# Patient Record
Sex: Female | Born: 1985 | Race: White | Hispanic: No | Marital: Single | Smoking: Former smoker
Health system: Southern US, Community
[De-identification: ages and names within clinical notes are randomized; demographics above are authoritative.]

## PROBLEM LIST (undated history)

## (undated) ENCOUNTER — Inpatient Hospital Stay (HOSPITAL_COMMUNITY): Payer: Self-pay

## (undated) HISTORY — PX: UMBILICAL HERNIA REPAIR: SHX196

---

## 2006-06-10 ENCOUNTER — Other Ambulatory Visit: Payer: Self-pay

## 2006-06-10 ENCOUNTER — Emergency Department: Payer: Self-pay | Admitting: Emergency Medicine

## 2007-09-26 ENCOUNTER — Emergency Department: Payer: Self-pay | Admitting: Emergency Medicine

## 2008-06-03 ENCOUNTER — Inpatient Hospital Stay: Payer: Self-pay | Admitting: Unknown Physician Specialty

## 2009-01-17 ENCOUNTER — Ambulatory Visit: Payer: Self-pay

## 2009-11-24 ENCOUNTER — Ambulatory Visit: Payer: Self-pay | Admitting: Surgery

## 2010-11-17 ENCOUNTER — Other Ambulatory Visit: Payer: Self-pay | Admitting: Psychiatry

## 2011-05-06 ENCOUNTER — Emergency Department: Payer: Self-pay | Admitting: Emergency Medicine

## 2015-10-31 NOTE — Progress Notes (Signed)
 Elizabeth Kane is seen for depression/anxiety, hyperlipidemia.  Contacted us  earlier this month with worsening depression symptoms.  Sertraline was restarted at 50 mg dose, and she really has done well with this.  She previously had been on 100 mg, but she feels like this dose has been effective.  No thoughts of harming herself or others.  Nothing that really set this off, just felt like she was a little bit more irritable, and states that some other people had noticed this.  She remains active, is getting adequate sleep.  She had labs done through work and her cholesterol remains slightly elevated; she is not sure if her thyroid was checked, and she will get us  a copy of those labs.  Current Outpatient Prescriptions  Medication Sig Dispense Refill  . norethindrone-ethinyl estradiol-iron (JUNEL FE 1.5/30, 28,) 1.5 mg-30 mcg (21)/75 mg (7) tablet Take 1 tablet by mouth once daily. 112 tablet 3  . sertraline (ZOLOFT) 50 MG tablet Take 1 tablet (50 mg total) by mouth once daily. 30 tablet 5   No current facility-administered medications for this visit.     Allergies as of 10/31/2015  . (No Known Allergies)    Past Medical History:  Diagnosis Date  . Anxiety and depression    with suicide gestures in the past, evaluated by Psychiatry  . Pure hypercholesterolemia 12/09/2014  . Severe menstrual cramps    followed by Gynecology    History reviewed. No pertinent surgical history.  Health Maintenance Topics with due status: Overdue     Topic Date Due   Influenza Vaccine 09/30/2015    History reviewed. No pertinent family history.  Social History   Social History  . Marital status: Single    Spouse name: N/A  . Number of children: N/A  . Years of education: N/A   Social History Main Topics  . Smoking status: Never Smoker  . Smokeless tobacco: Never Used  . Alcohol use Yes     Comment: Very rare alcohol.  . Drug use: None  . Sexual activity: Not Asked   Other Topics Concern  .  None   Social History Narrative    Results for orders placed or performed in visit on 12/02/14  CBC w/auto Differential (5 Part)  Result Value Ref Range   WBC (White Blood Cell Count) 7.0 4.1 - 10.2 10^3/uL   RBC (Red Blood Cell Count) 4.62 4.04 - 5.48 10^6/uL   Hemoglobin 14.2 12.0 - 15.0 gm/dL   Hematocrit 58.2 64.9 - 47.0 %   MCV (Mean Corpuscular Volume) 90.3 80.0 - 100.0 fl   MCH (Mean Corpuscular Hemoglobin) 30.7 27.0 - 31.2 pg   MCHC (Mean Corpuscular Hemoglobin Concentration) 34.1 32.0 - 36.0 gm/dL   Platelet Count 706 849 - 450 10^3/uL   RDW-CV (Red Cell Distribution Width) 13.0 11.6 - 14.8 %   MPV (Mean Platelet Volume) 11.1 (H) 8.0 - 10.0 fl   Neutrophils 2.89 1.50 - 7.80 10^3/uL   Lymphocytes 3.20 1.00 - 3.60 10^3/uL   Monocytes 0.55 0.00 - 1.50 10^3/uL   Eosinophils 0.21 0.00 - 0.55 10^3/uL   Basophils 0.14 (H) 0.00 - 0.09 10^3/uL   Neutrophil % 41.3 32.0 - 70.0 %   Lymphocyte % 45.7 10.0 - 50.0 %   Monocyte % 7.9 4.0 - 13.0 %   Eosinophil % 3.0 1.0 - 5.0 %   Basophil% 2.0 0.0 - 2.0 %   Immature Granulocyte % 0.1 <=0.7 %   Immature Granulocyte Count 0.01 <=0.06 10^3/L  Comprehensive  Metabolic Panel (CMP)  Result Value Ref Range   Glucose 85 70 - 110 mg/dL   Sodium 860 863 - 854 mmol/L   Potassium 4.2 3.6 - 5.1 mmol/L   Chloride 105 97 - 109 mmol/L   Carbon Dioxide (CO2) 26.5 22.0 - 32.0 mmol/L   Urea Nitrogen (BUN) 10 7 - 25 mg/dL   Creatinine 0.8 0.6 - 1.1 mg/dL   Glomerular Filtration Rate (eGFR), MDRD Estimate 85 >60 mL/min/1.73sq m   Calcium 9.2 8.7 - 10.3 mg/dL   AST  17 8 - 39 U/L   ALT  13 5 - 38 U/L   Alk Phos (alkaline Phosphatase) 43 34 - 104 U/L   Albumin 4.2 3.5 - 4.8 g/dL   Bilirubin, Total 0.6 0.3 - 1.2 mg/dL   Protein, Total 7.0 6.1 - 7.9 g/dL   A/G Ratio 1.5 1.0 - 5.0 gm/dL  Lipid Panel w/calc LDL  Result Value Ref Range   Cholesterol, Total 211 (H) 100 - 200 mg/dL   Triglyceride 99 35 - 199 mg/dL   HDL (High Density Lipoprotein)  Cholesterol 59.8 35.0 - 85.0 mg/dL   LDL (Low Density Lipoprotien), Calculated 131 (H) 0 - 130 mg/dL   VLDL Cholesterol 20 mg/dL   Cholesterol/HDL Ratio 3.5   Thyroid Stimulating-Hormone (TSH)  Result Value Ref Range   Thyroid Stimulating Hormone (TSH) 4.649 0.340 - 5.600 uIU/mL  Urinalysis w/Microscopic  Result Value Ref Range   Color Yellow Yellow, Straw   Clarity Clear Clear   Specific Gravity 1.020 1.000 - 1.030   pH, Urine 6.0 5.0 - 8.0   Protein, Urinalysis Negative Negative, Trace mg/dL   Glucose, Urinalysis Negative Negative mg/dL   Ketones, Urinalysis Negative Negative mg/dL   Blood, Urinalysis Large (!) Negative   Nitrite, Urinalysis Negative Negative   Leukocyte Esterase, Urinalysis Negative Negative   White Blood Cells, Urinalysis 0-3 None Seen, 0-3 /hpf   Red Blood Cells, Urinalysis 4-10 (!) None Seen, 0-3 /hpf   Bacteria, Urinalysis Rare (!) None Seen /hpf   Squamous Epithelial Cells, Urinalysis Moderate (!) Rare, Few, None Seen /hpf    ROS: Please see HPI; recently saw gynecology remainder of complete 10 point ROS is negative  BP (P) 110/80  Pulse (P) 77  Ht (P) 157.5 cm (5' 2)  Wt (P) 63 kg (139 lb)  LMP 10/30/2015 (Approximate)  SpO2 (P) 97%  BMI (P) 25.42 kg/m2  GENERAL:  Well developed, well nourished female, in no distress. NECK:  Supple, trachea midline.  No thyromegaly.   CHEST:  Normal to palpation. LUNGS:  Clear to auscultation and percussion.  No retractions or wheezing.  CARDIAC:  Regular rate and rhythm, normal S1 and S2 without murmurs, rubs or gallops.   EXTREMITIES:  No clubbing, cyanosis, edema.   NEUROLOGIC:  Cranial nerves II-XII grossly intact.   Gait normal DERMATOLOGIC:   No significant rashes or nodules. LYMPH:  No cervical or supraclavicular lymphadenopathy    Pure hypercholesterolemia  (primary encounter diagnosis) Recurrent major depressive disorder, in full remission (CMS-HCC)  Assessment and Plan  1.   Depression/anxiety-stable with current regimen.  She knows to contact us  if symptoms are worsening; she may see some further improvement as she is on this a little bit longer.  She is aware that she should not stop the medication abruptly; she has used in the past 2.  Hyperlipidemia.  Has been mildly elevated in the past; urged efforts with diet, exercise as tolerated.  Follow liver, lipids, metabolic panel, TSH  3.  Health maintenance.  She will return for annual exam in 2 months, returning sooner if needed.  She will bring her labs in from [employer] [employer].  BERT JACK KLEIN III, MD  Portions of this note were created using dictation software and may contain typographical errors.

## 2016-06-23 ENCOUNTER — Encounter: Payer: Self-pay | Admitting: *Deleted

## 2016-06-23 ENCOUNTER — Emergency Department: Payer: Managed Care, Other (non HMO)

## 2016-06-23 ENCOUNTER — Emergency Department
Admission: EM | Admit: 2016-06-23 | Discharge: 2016-06-23 | Disposition: A | Discharge status: Home / Self Care | Payer: Managed Care, Other (non HMO) | Attending: Emergency Medicine | Admitting: Emergency Medicine

## 2016-06-23 DIAGNOSIS — R110 Nausea: Secondary | ICD-10-CM | POA: Diagnosis not present

## 2016-06-23 DIAGNOSIS — R1030 Lower abdominal pain, unspecified: Secondary | ICD-10-CM | POA: Insufficient documentation

## 2016-06-23 DIAGNOSIS — R109 Unspecified abdominal pain: Secondary | ICD-10-CM

## 2016-06-23 LAB — CBC
HEMATOCRIT: 43 % (ref 35.0–47.0)
HEMOGLOBIN: 14.9 g/dL (ref 12.0–16.0)
MCH: 29.9 pg (ref 26.0–34.0)
MCHC: 34.7 g/dL (ref 32.0–36.0)
MCV: 86.2 fL (ref 80.0–100.0)
Platelets: 295 10*3/uL (ref 150–440)
RBC: 4.99 MIL/uL (ref 3.80–5.20)
RDW: 14.2 % (ref 11.5–14.5)
WBC: 9.5 10*3/uL (ref 3.6–11.0)

## 2016-06-23 LAB — COMPREHENSIVE METABOLIC PANEL
ALBUMIN: 4.1 g/dL (ref 3.5–5.0)
ALK PHOS: 53 U/L (ref 38–126)
ALT: 18 U/L (ref 14–54)
ANION GAP: 9 (ref 5–15)
AST: 23 U/L (ref 15–41)
BUN: 9 mg/dL (ref 6–20)
CALCIUM: 9.2 mg/dL (ref 8.9–10.3)
CHLORIDE: 101 mmol/L (ref 101–111)
CO2: 26 mmol/L (ref 22–32)
Creatinine, Ser: 0.88 mg/dL (ref 0.44–1.00)
GFR calc non Af Amer: >60 mL/min (ref >60)
Glucose, Bld: 95 mg/dL (ref 65–99)
POTASSIUM: 3.8 mmol/L (ref 3.5–5.1)
SODIUM: 136 mmol/L (ref 135–145)
Total Bilirubin: 0.7 mg/dL (ref 0.3–1.2)
Total Protein: 7.6 g/dL (ref 6.5–8.1)

## 2016-06-23 LAB — URINALYSIS, COMPLETE (UACMP) WITH MICROSCOPIC
BACTERIA UA: NONE SEEN
Bilirubin Urine: NEGATIVE
Glucose, UA: NEGATIVE mg/dL
HGB URINE DIPSTICK: NEGATIVE
KETONES UR: NEGATIVE mg/dL
NITRITE: NEGATIVE
PROTEIN: 30 mg/dL — AB
Specific Gravity, Urine: 1.026 (ref 1.005–1.030)
pH: 6 (ref 5.0–8.0)

## 2016-06-23 LAB — POCT PREGNANCY, URINE: PREG TEST UR: NEGATIVE

## 2016-06-23 LAB — LIPASE, BLOOD: LIPASE: 22 U/L (ref 11–51)

## 2016-06-23 MED ORDER — IOPAMIDOL (ISOVUE-300) INJECTION 61%
100.0000 mL | Freq: Once | INTRAVENOUS | Status: AC | PRN
  Administered 2016-06-23: 100 mL via INTRAVENOUS

## 2016-06-23 MED ORDER — ONDANSETRON HCL 4 MG/2ML IJ SOLN
4.0000 mg | Freq: Once | INTRAMUSCULAR | Status: AC
  Administered 2016-06-23: 4 mg via INTRAVENOUS
  Filled 2016-06-23: qty 2

## 2016-06-23 MED ORDER — IOPAMIDOL (ISOVUE-300) INJECTION 61%
30.0000 mL | Freq: Once | INTRAVENOUS | Status: AC | PRN
  Administered 2016-06-23: 30 mL via ORAL

## 2016-06-23 MED ORDER — HYDROCODONE-ACETAMINOPHEN 5-325 MG PO TABS: 2.0000 | ORAL_TABLET | Freq: Four times a day (QID) | ORAL | 0 refills | Status: AC | PRN

## 2016-06-23 MED ORDER — MORPHINE SULFATE (PF) 4 MG/ML IV SOLN
4.0000 mg | Freq: Once | INTRAVENOUS | Status: AC
  Administered 2016-06-23: 4 mg via INTRAVENOUS
  Filled 2016-06-23: qty 1

## 2016-06-23 NOTE — Discharge Instructions (Signed)
Call [facility] surgical today. Ask for an appointment tomorrow for follow-up for your belly pain. Please return here for worse pain fever vomiting or feeling sicker. Use the Vicodin one pill 4 times a day if needed for pain. If you cannot get in to see [facility] surgical, you can return here. If your pain is gone tomorrow you do not have to follow-up.

## 2016-06-23 NOTE — ED Notes (Signed)
Pt to ed with c/o lower abd pain since yesterday at 2 am.  Pt states nausea, denies v/d.  Reports pain is constant and worse with bending or movement.  Pt alert and oriented, family at bedside.  Skin warm and dry.

## 2016-06-23 NOTE — ED Provider Notes (Signed)
[facility] Emergency Department Provider Note   ____________________________________________   First MD Initiated Contact with Patient 06/23/16 1118     (approximate)  I have reviewed the triage vital signs and the nursing notes.   HISTORY  Chief Complaint Abdominal Pain    HPI Elizabeth Kane is a 31 y.o. female who woke up at 3:00 Tuesday morning yesterday with the pain. Pain woke her up. She says it makes her somewhat nauseated but she's not had any vomiting or diarrhea. The pain has been constant since onset and is worse with movement. It is also worse with eating. Just a deep achy pain in both high and the umbilicus that feels like she did too many sit up she says. She has not been exercising or doing anything else that would have strained her abdominal muscles. She is not having any fever.   History reviewed. No pertinent past medical history.  There are no active problems to display for this patient.   History reviewed. No pertinent surgical history.  Prior to Admission medications   Medication Sig Start Date End Date Taking? Authorizing Provider  HYDROcodone-acetaminophen (NORCO) 5-325 MG tablet Take 2 tablets by mouth every 6 (six) hours as needed for moderate pain. 06/23/16   Arnaldo Natal, MD    Allergies Patient has no known allergies.  No family history on file.  Social History Social History  Substance Use Topics  . Smoking status: Never Smoker  . Smokeless tobacco: Not on file  . Alcohol use Yes    Review of Systems  Constitutional: No fever/chills Eyes: No visual changes. ENT: No sore throat. Cardiovascular: Denies chest pain. Respiratory: Denies shortness of breath. Gastrointestinal: See history of present illness. Genitourinary: Negative for dysuria. Musculoskeletal: Negative for back pain. Skin: Negative for rash. Neurological: Negative for headaches, focal weakness or  numbness.   ____________________________________________   PHYSICAL EXAM:  VITAL SIGNS: ED Triage Vitals  Enc Vitals Group     BP 06/23/16 0946 117/76     Pulse Rate 06/23/16 0946 88     Resp 06/23/16 0946 16     Temp 06/23/16 0946 99 F (37.2 C)     Temp Source 06/23/16 0946 Oral     SpO2 06/23/16 0946 98 %     Weight 06/23/16 0946 142 lb (64.4 kg)     Height 06/23/16 0946  (1.6 m)     Head Circumference --      Peak Flow --      Pain Score 06/23/16 0944 7     Pain Loc --      Pain Edu? --      Excl. in GC? --     Constitutional: Alert and oriented. Well appearing and in no acute distress. Eyes: Conjunctivae are normal. PERRL. EOMI. Head: Atraumatic. Nose: No congestion/rhinnorhea. Mouth/Throat: Mucous membranes are moist.  Oropharynx non-erythematous. Neck: No stridor. Cardiovascular: Normal rate, regular rhythm. Grossly normal heart sounds.  Good peripheral circulation. Respiratory: Normal respiratory effort.  No retractions. Lungs CTAB. Gastrointestinal: Soft Tender in the mid abdomen to palpation and percussion not tender elsewhere. Bowel sounds are normal. No distention. No abdominal bruits. No CVA tenderness. Musculoskeletal: No lower extremity tenderness nor edema.  No joint effusions. Neurologic:  Normal speech and language. No gross focal neurologic deficits are appreciated. No gait instability. Skin:  Skin is warm, dry and intact. No rash noted. Psychiatric: Mood and affect are normal. Speech and behavior are normal.  ____________________________________________   LABS (  all labs ordered are listed, but only abnormal results are displayed)  Labs Reviewed  URINALYSIS, COMPLETE (UACMP) WITH MICROSCOPIC - Abnormal; Notable for the following:       Result Value   Color, Urine AMBER (*)    APPearance CLOUDY (*)    Protein, ur 30 (*)    Leukocytes, UA TRACE (*)    Squamous Epithelial / LPF 0-5 (*)    All other components within normal limits  LIPASE,  BLOOD  COMPREHENSIVE METABOLIC PANEL  CBC  POCT PREGNANCY, URINE   ____________________________________________  EKG   ____________________________________________  RADIOLOGY  Study Result   CLINICAL DATA:  Lower abdominal pain since yesterday.  EXAM: CT ABDOMEN AND PELVIS WITH CONTRAST  TECHNIQUE: Multidetector CT imaging of the abdomen and pelvis was performed using the standard protocol following bolus administration of intravenous contrast.  CONTRAST:  ISOVUE-300 IOPAMIDOL (ISOVUE-300) INJECTION 61%  COMPARISON:  None.  FINDINGS: Lower chest: Lung bases are clear. No effusions. Heart is normal size.  Hepatobiliary: No focal hepatic abnormality. Gallbladder unremarkable.  Pancreas: No focal abnormality or ductal dilatation.  Spleen: No focal abnormality.  Normal size.  Adrenals/Urinary Tract: No adrenal abnormality. No focal renal abnormality. No stones or hydronephrosis. Urinary bladder is unremarkable.  Stomach/Bowel: Stomach, large and small bowel grossly unremarkable. Appendix mildly prominent at 8 mm. No surrounding inflammatory changes.  Vascular/Lymphatic: No evidence of aneurysm or adenopathy.  Reproductive: Uterus and adnexa unremarkable.  No mass.  Other: Small amount of free fluid in the pelvis.  No free air.  Musculoskeletal: No acute bony abnormality.  IMPRESSION: Appendix borderline in diameter at 8 mm. No surrounding inflammatory change.  Small amount of free fluid in the pelvis, likely physiologic.   Electronically Signed   By: Charlett Nose M.D.   On: 06/23/2016 13:07     ____________________________________________   PROCEDURES  Procedure(s) performed:   Procedures  Critical Care performed:   ____________________________________________   INITIAL IMPRESSION / ASSESSMENT AND PLAN / ED COURSE  Pertinent labs & imaging results that were available during my care of the patient were reviewed  by me and considered in my medical decision making (see chart for details).   Discussed with patient and family need to follow-up and return if she is worse because while the appendix does not look inflamed at present it could still turn into appendicitis or something bad. Patient still has pain around the umbilicus and not lower than that specifically not in the right lower quadrant and not suprapubically.     ____________________________________________   FINAL CLINICAL IMPRESSION(S) / ED DIAGNOSES  Final diagnoses:  Abdominal pain, unspecified abdominal location      NEW MEDICATIONS STARTED DURING THIS VISIT:  Discharge Medication List as of 06/23/2016  2:43 PM    START taking these medications   Details  HYDROcodone-acetaminophen (NORCO) 5-325 MG tablet Take 2 tablets by mouth every 6 (six) hours as needed for moderate pain., Starting Wed 06/23/2016, Print         Note:  This document was prepared using Dragon voice recognition software and may include unintentional dictation errors.    Arnaldo Natal, MD 06/23/16 [place] [place]

## 2016-06-23 NOTE — ED Triage Notes (Signed)
Pt reports abdominal pain starting yesterday, pt denies any other symptoms, pt denies fever

## 2016-06-24 ENCOUNTER — Encounter: Payer: Self-pay | Admitting: Surgery

## 2016-06-24 ENCOUNTER — Ambulatory Visit (INDEPENDENT_AMBULATORY_CARE_PROVIDER_SITE_OTHER): Payer: Managed Care, Other (non HMO) | Admitting: Surgery

## 2016-06-24 VITALS — BP 115/79 | HR 85 | Temp 98.4°F | Ht 63.0 in | Wt 142.0 lb

## 2016-06-24 DIAGNOSIS — R1030 Lower abdominal pain, unspecified: Secondary | ICD-10-CM | POA: Diagnosis not present

## 2016-06-24 NOTE — Progress Notes (Signed)
06/24/2016  Reason for Visit:  Abdominal pain  History of Present Illness: Elizabeth Kane is a 31 y.o. female referred by the ED for abdominal pain.  The patient presented to the ED yesterday with a two day history of abdominal pain.  She reported the pain in the lower abdomen in a band-like distribution, and now the pain has changed and is more located in the upper medial aspect of the LLQ.  Denies any nausea or vomiting but reports decreased appetite.  Reports some sweats at night but denies any specific fevers.  Denies any diarrhea or vomiting.  Denies any dysuria or hematuria.  She does have endometriosis and does have pain with her menstrual cycle, but she takes OCP and her next cycle wont be for another month.  She reports that this pain is different compared to her endometriosis pain.  In the ED, labs and CT were done.  She had a normal WBC of 9.5, normal LFTs, normal electrolytes.  Her CT scan was fairly unremarkable, though the radiologist points at her appendix being 8 mm in size without any periappendiceal stranding.   Past Medical History: --Endometriosis  Past Surgical History: Past Surgical History:  Procedure Laterality Date  . UMBILICAL and bilateral inguinal HERNIA REPAIR  child    Home Medications: Prior to Admission medications   Medication Sig Start Date End Date Taking? Authorizing Provider  HYDROcodone-acetaminophen (NORCO) 5-325 MG tablet Take 2 tablets by mouth every 6 (six) hours as needed for moderate pain. 06/23/16  Yes Arnaldo Natal, MD    Allergies: No Known Allergies  Social History:  reports that she quit smoking about 2 years ago. She has never used smokeless tobacco. She reports that she drinks alcohol. She reports that she does not use drugs.   Family History: Family History  Problem Relation Age of Onset  . Hypertension Mother   . Diabetes Maternal Grandmother   . Heart disease Maternal Grandfather   . Heart disease Paternal Grandmother   .  Heart disease Paternal Grandfather     Review of Systems: Review of Systems  Constitutional: Negative for fever.       Sweats  HENT: Negative for hearing loss.   Eyes: Negative for blurred vision.  Respiratory: Negative for cough and shortness of breath.   Cardiovascular: Negative for chest pain and leg swelling.  Gastrointestinal: Negative for abdominal pain, blood in stool, constipation, diarrhea, nausea and vomiting.  Genitourinary: Negative for dysuria and hematuria.  Musculoskeletal: Negative for myalgias.  Skin: Negative for rash.  Neurological: Negative for dizziness.  Psychiatric/Behavioral: Negative for depression.  All other systems reviewed and are negative.   Physical Exam BP 115/79   Pulse 85   Temp 98.4 F (36.9 C) (Oral)   Ht  (1.6 m)   Wt 64.4 kg (142 lb)   BMI 25.15 kg/m  CONSTITUTIONAL: No acute distress HEENT:  Normocephalic, atraumatic, extraocular motion intact. NECK: Trachea is midline, and there is no jugular venous distension.  RESPIRATORY:  Lungs are clear, and breath sounds are equal bilaterally. Normal respiratory effort without pathologic use of accessory muscles. CARDIOVASCULAR: Heart is regular without murmurs, gallops, or rubs. GI: The abdomen is soft, nondistended, with mild tenderness to palpation over the umbilicus and upper medial left lower quadrant.  No peritoneal signs.  No pain at McBurney's point.  No hernia recurrence noted on exam. There were no palpable masses. MUSCULOSKELETAL:  Normal muscle strength and tone in all four extremities.  No peripheral edema or  cyanosis. SKIN: Skin turgor is normal. There are no pathologic skin lesions.  NEUROLOGIC:  Motor and sensation is grossly normal.  Cranial nerves are grossly intact. PSYCH:  Alert and oriented to person, place and time. Affect is normal.  Laboratory Analysis: No results found for this or any previous visit (from the past 24 hour(s)).  Imaging: Ct Abdomen Pelvis W  Contrast  Result Date: 06/23/2016 CLINICAL DATA:  Lower abdominal pain since yesterday. EXAM: CT ABDOMEN AND PELVIS WITH CONTRAST TECHNIQUE: Multidetector CT imaging of the abdomen and pelvis was performed using the standard protocol following bolus administration of intravenous contrast. CONTRAST:  ISOVUE-300 IOPAMIDOL (ISOVUE-300) INJECTION 61% COMPARISON:  None. FINDINGS: Lower chest: Lung bases are clear. No effusions. Heart is normal size. Hepatobiliary: No focal hepatic abnormality. Gallbladder unremarkable. Pancreas: No focal abnormality or ductal dilatation. Spleen: No focal abnormality.  Normal size. Adrenals/Urinary Tract: No adrenal abnormality. No focal renal abnormality. No stones or hydronephrosis. Urinary bladder is unremarkable. Stomach/Bowel: Stomach, large and small bowel grossly unremarkable. Appendix mildly prominent at 8 mm. No surrounding inflammatory changes. Vascular/Lymphatic: No evidence of aneurysm or adenopathy. Reproductive: Uterus and adnexa unremarkable.  No mass. Other: Small amount of free fluid in the pelvis.  No free air. Musculoskeletal: No acute bony abnormality. IMPRESSION: Appendix borderline in diameter at 8 mm. No surrounding inflammatory change. Small amount of free fluid in the pelvis, likely physiologic. Electronically Signed   By: Charlett Nose M.D.   On: 06/23/2016 13:07    Assessment and Plan: This is a 31 y.o. female who presents with a three day history of abdominal pain.  I have independently reviewed the patient's laboratory and imaging studies.  There is no clear etiology to the patient's abdominal pain.  The appendix is normal appearing despite of measuring 8 mm.  There is no periappendiceal stranding or fluid.  There is no evidence of diverticular disease or diverticulitis.  There is no evidence of small bowel obstruction.  There is moderate stool burden in the colon, particularly transverse through rectum.  There is no hernia on her CT  scan.  --Reassured the patient that at time point, there is no clear indication for any surgical intervention.  There is no evidence of infection and thus antibiotics at this point are also not warranted. --Discussed with the patient that this could be Ob/Gyn in origin given her endometriosis history, vs potential for ovarian cysts, that the CT scan unfortunately does not see well.  Recommended that given the stool burden in her colon, could try taking MiraLax for the next few days to see if her pain improves as her bowels empty out more.  If there is no improvement over the next couple of days, the patient understands that she should call or come to the hospital for further evaluation.  She may need repeat labs or a urinalysis or possibly pelvic ultrasound. --The patient understands this plan and all of her questions have been answered.  Face-to-face time spent with the patient and care providers was 45 minutes, with more than 50% of the time spent counseling, educating, and coordinating care of the patient.     Howie Ill, MD [facility]

## 2016-06-24 NOTE — Patient Instructions (Signed)
We would like for you to try Miralax for your constipation. This can be purchased at [place] or any drug store. You may also try Advil or Motrin for the pain you are experiencing. Please call our office if you are no better after using the Miralax.  Constipation, Adult Constipation is when a person:  Poops (has a bowel movement) fewer times in a week than normal.  Has a hard time pooping.  Has poop that is dry, hard, or bigger than normal. Follow these instructions at home: Eating and drinking    Eat foods that have a lot of fiber, such as:  Fresh fruits and vegetables.  Whole grains.  Beans.  Eat less of foods that are high in fat, low in fiber, or overly processed, such as:  [place] fries.  Hamburgers.  Cookies.  Candy.  Soda.  Drink enough fluid to keep your pee (urine) clear or pale yellow. General instructions   Exercise regularly or as told by your doctor.  Go to the restroom when you feel like you need to poop. Do not hold it in.  Take over-the-counter and prescription medicines only as told by your doctor. These include any fiber supplements.  Do pelvic floor retraining exercises, such as:  Doing deep breathing while relaxing your lower belly (abdomen).  Relaxing your pelvic floor while pooping.  Watch your condition for any changes.  Keep all follow-up visits as told by your doctor. This is important. Contact a doctor if:  You have pain that gets worse.  You have a fever.  You have not pooped for 4 days.  You throw up (vomit).  You are not hungry.  You lose weight.  You are bleeding from the anus.  You have thin, pencil-like poop (stool). Get help right away if:  You have a fever, and your symptoms suddenly get worse.  You leak poop or have blood in your poop.  Your belly feels hard or bigger than normal (is bloated).  You have very bad belly pain.  You feel dizzy or you faint. This information is not intended to replace advice  given to you by your health care provider. Make sure you discuss any questions you have with your health care provider. Document Released: 08/04/2007 Document Revised: 09/05/2015 Document Reviewed: 08/06/2015 Elsevier Interactive Patient Education  2017 [employer].

## 2016-06-25 ENCOUNTER — Telehealth: Payer: Self-pay

## 2016-06-25 NOTE — Telephone Encounter (Signed)
Patient would like to know if we can fill out her FMLA paperwork (has been obtained). She would also like to know if we can write her a return to work letter. They are not letting her return to work without one.

## 2016-06-25 NOTE — Telephone Encounter (Signed)
Disability documents have been received.

## 2016-06-25 NOTE — Telephone Encounter (Signed)
Patient does not need surgery.

## 2016-06-28 ENCOUNTER — Telehealth: Payer: Self-pay

## 2016-06-28 NOTE — Telephone Encounter (Signed)
Patient called this morning wanting a letter to return to work today. I told her that I would write her one and to please pick it up in 15 minutes. Patient agreed. Patient also asked if I could fill out her FMLA Forms. I told her that if she pays her $25 fee, then I would be able to fill out her form and then I will fax it later on this afternoon. Patient agreed as well.

## 2016-06-28 NOTE — Telephone Encounter (Signed)
Called patient but had to leave a detailed message letting her know that she needs to ask [employer] to refax her Disability Form.

## 2016-06-28 NOTE — Telephone Encounter (Signed)
Called patient back to let her know that I had found her disability forms. I also asked her if she had paid her $25 fee for her disability forms. Patient stated that she was told by Selena Batten that since the receptionist was not here today, she would tell the front desk staff to mail her the disability fee.

## 2016-06-29 DIAGNOSIS — R1030 Lower abdominal pain, unspecified: Secondary | ICD-10-CM

## 2016-06-29 NOTE — Telephone Encounter (Signed)
We do not mail out the payment fees. I have called the patient and collected the payment over the phone. Documents were already faxed, I have scanned them into [employer] under Media.   Disability Paperwork has been received and a $25.00 collection fee has been obtained.

## 2017-02-23 NOTE — Progress Notes (Signed)
 Elizabeth Kane is a 31 y.o. female  CHIEF COMPLAINT: Chief Complaint  Patient presents with  . Fever  . Sore Throat    x 4 days  . Sinus Problem    SUBJECTIVE:  Patient notes recent cough, congestion, and shest discomfort for 5 days. Has ha fever up to 101. Throat is scratchy, feels hot. No nausea, vomiting, mild diarrhea. No achiness. No other sick contacts. Has tried over the counter medications without relief.   pak Current Outpatient Medications: cetirizine (ZYRTEC) 10 MG tablet, Take 1 tablet (10 mg total) by mouth once daily., Taking fluticasone (FLONASE) 50 mcg/actuation nasal spray, Place 2 sprays into both nostrils once daily., Taking norethindrone-ethinyl estradiol-iron (JUNEL FE 1.5/30, 28,) 1.5 mg-30 mcg (21)/75 mg (7) tablet, Take 1 tablet by mouth once daily., Taking sertraline (ZOLOFT) 50 MG tablet, TAKE 1 TABLET BY MOUTH ONCE DAILY, Taking triamcinolone 0.1 % cream, Apply topically 2 (two) times daily., Taking azithromycin (ZITHROMAX) 250 MG tablet, Take 2 tablets (500mg ) by mouth on Day 1. Take 1 tablet (250mg ) by mouth on Days 2-5.  ALLERGIES: Patient has no known allergies.  REVIEW OF SYSTEMS: Please see CC; remainder of complete ROS is negative  PHYSICAL EXAM: BP 98/68 (BP Location: Left upper arm, Patient Position: Sitting, BP Cuff Size: Adult)   Ht 157.5 cm (5' 2)   Wt 68.2 kg (150 lb 6.4 oz)   BMI 27.51 kg/m  General- Well developed, well-nourished, appears mildly ill HEENT- PERRL,  posterior oropharynx with lymphoid hyperplasia, nasal mucosa reddened, TMs clear Neck- supple, trachea midline, no thyromegaly Chest- normal to palpation Lungs-Clear to auscultation bilaterally without wheezing, rhonchi or crackles. Heart- RRR without murmur, gallop, rub Ext- no clubbing, cyanosis, edema Lymph- no cervical or supraclavicular nodes  ASSESSMENT AND PLAN:  Acute URI/ early bronchitis-  Reviewed OTC meds to use PRN. Zpak as directed. Push fluids/ rest,  follow up if not improving.  No follow-ups on file.   Will contact us  sooner for new or worsening symptoms.   MIRIAM KLEM [place], PA

## 2018-07-05 IMAGING — CT CT ABD-PELV W/ CM
2 of 4 series · 16 of 46 positions shown, 18 images · IV contrast (iopamidol)
Comparison: None.

CLINICAL DATA: Lower abdominal pain since yesterday.

EXAM:
CT ABDOMEN AND PELVIS WITH CONTRAST
TECHNIQUE: Multidetector CT imaging of the abdomen and pelvis was performed
using the standard protocol following bolus administration of
intravenous contrast.
CONTRAST:  100mL [id number] IOPAMIDOL ([id number]) INJECTION 61%

[Series 2: axial st · axial · 0.79mm/px · z∈[-982,-587]mm · 13 of 87 slices shown, 15 images]
[im 4/87  soft-tissue]
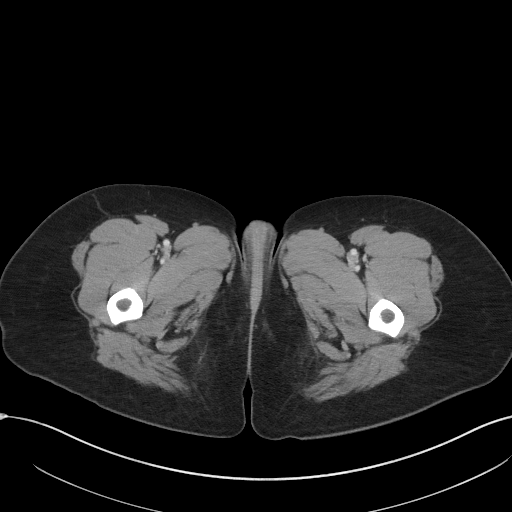
[im 4/87  bone]
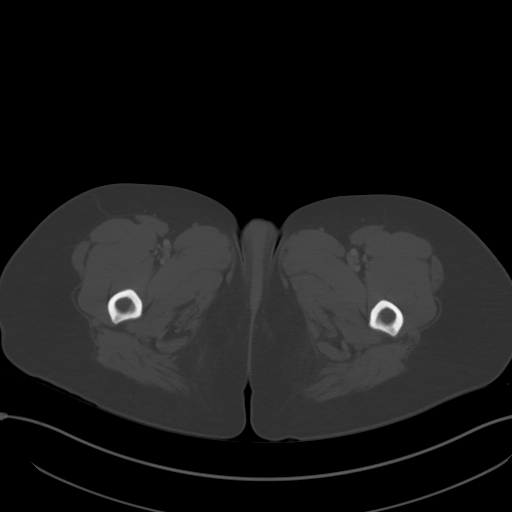
[im 11/87  soft-tissue]
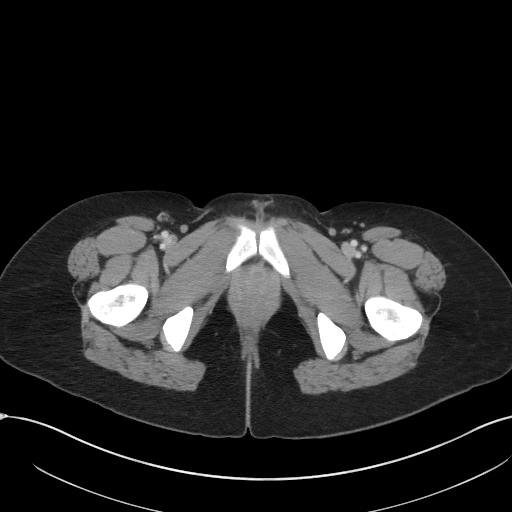
[im 18/87  soft-tissue]
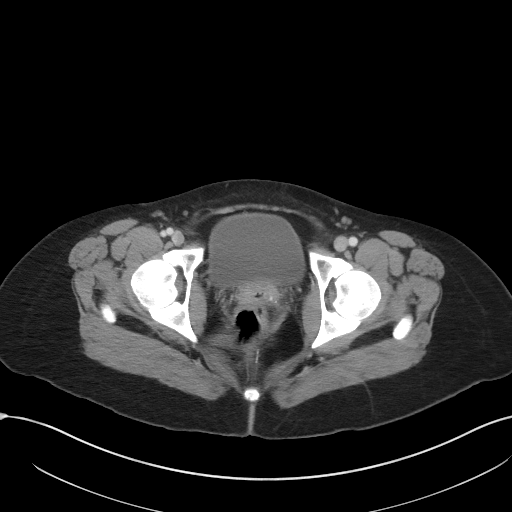
[im 26/87  soft-tissue]
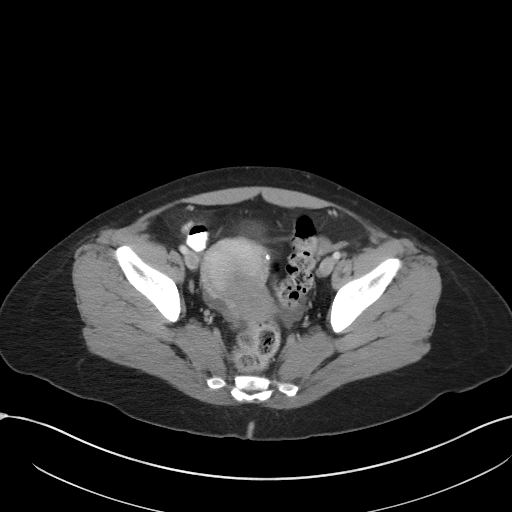
[im 29/87  soft-tissue]
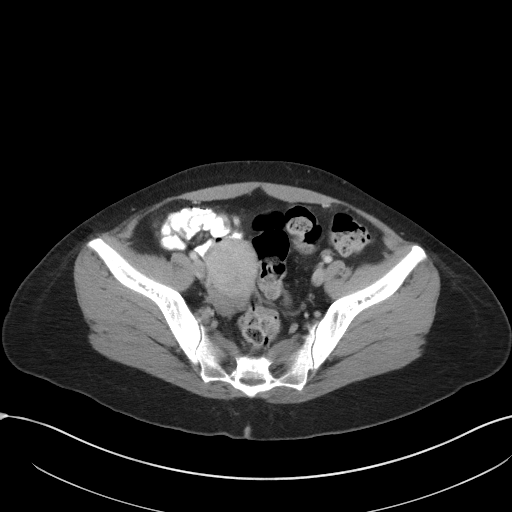
[im 36/87  soft-tissue]
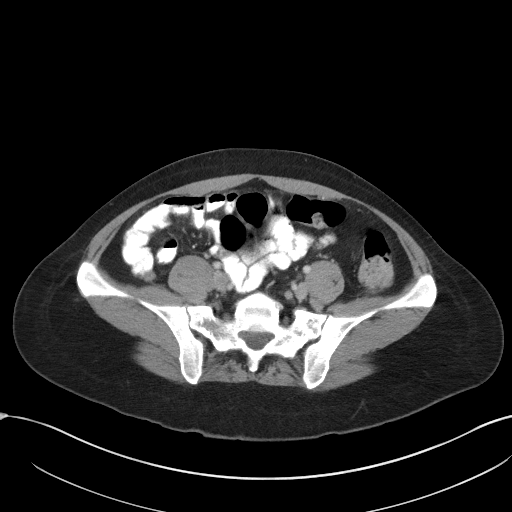
[im 44/87  soft-tissue]
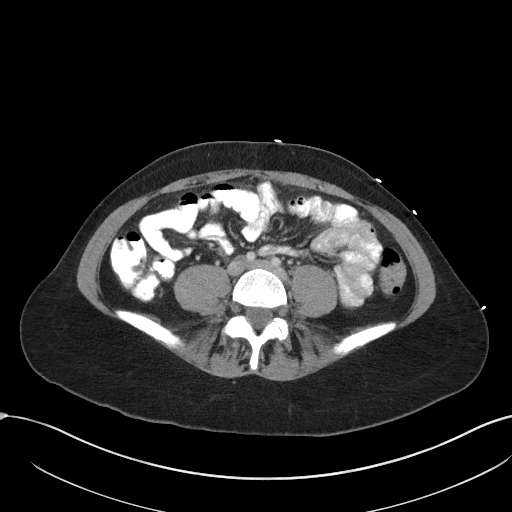
[im 51/87  soft-tissue]
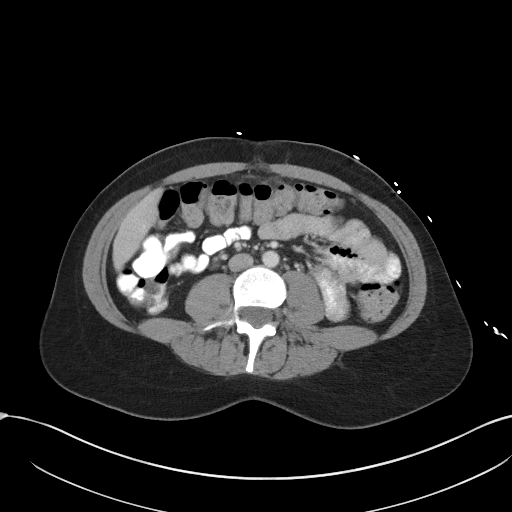
[im 58/87  soft-tissue]
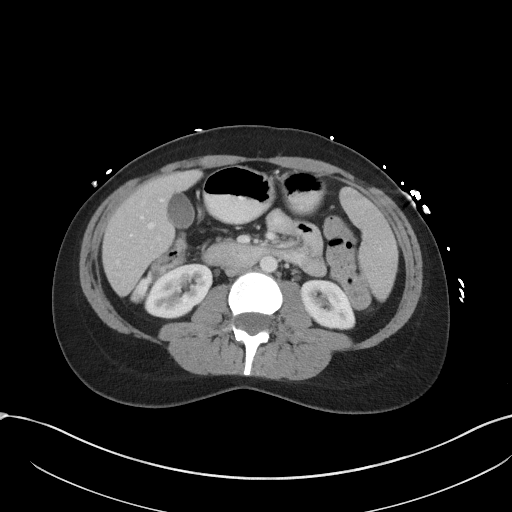
[im 58/87  bone]
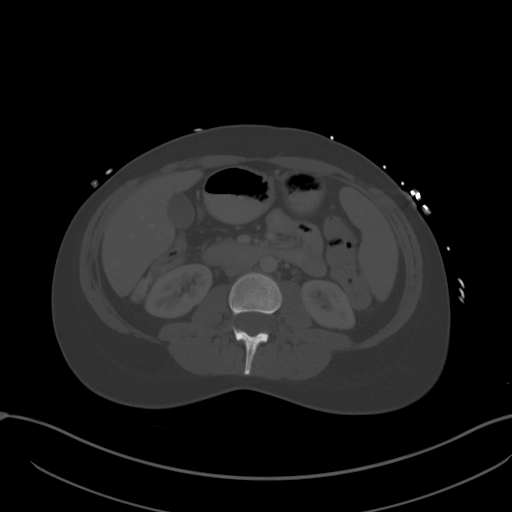
[im 61/87  soft-tissue]
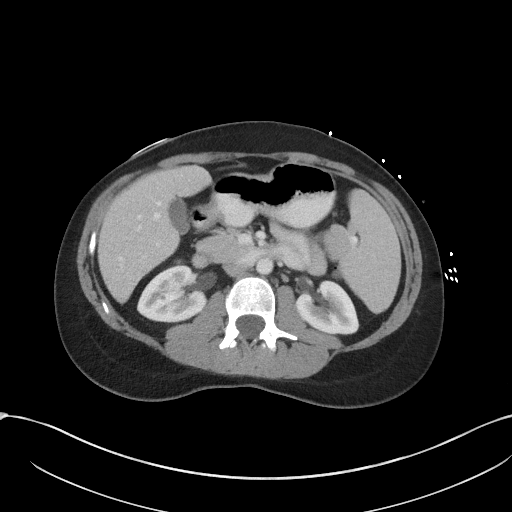
[im 69/87  soft-tissue]
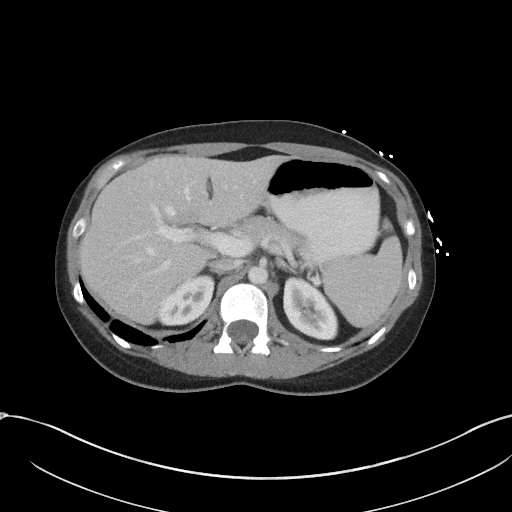
[im 76/87  soft-tissue]
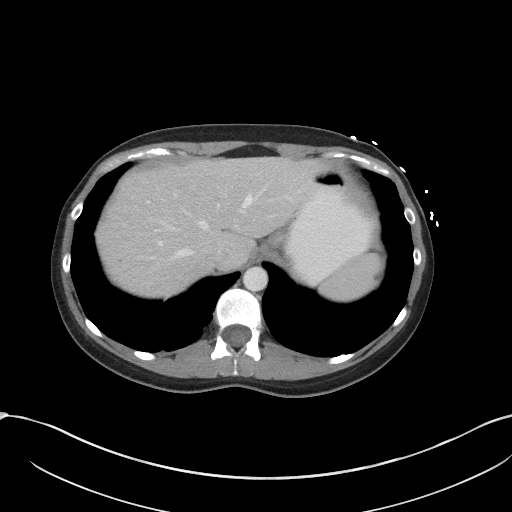
[im 83/87  soft-tissue]
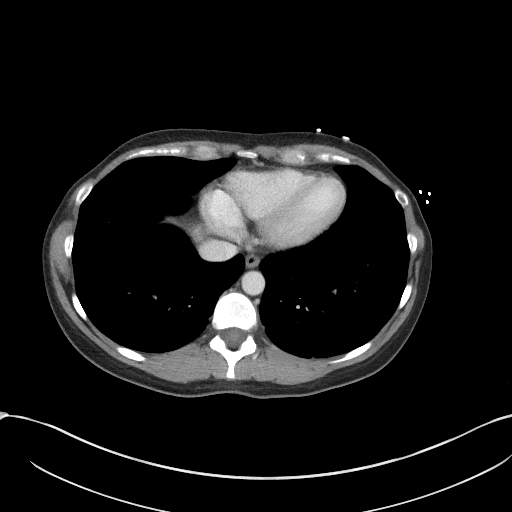

[Series 5: coronal st · coronal · 0.64mm/px · 3 of 77 slices shown]
[im 26/77  soft-tissue]
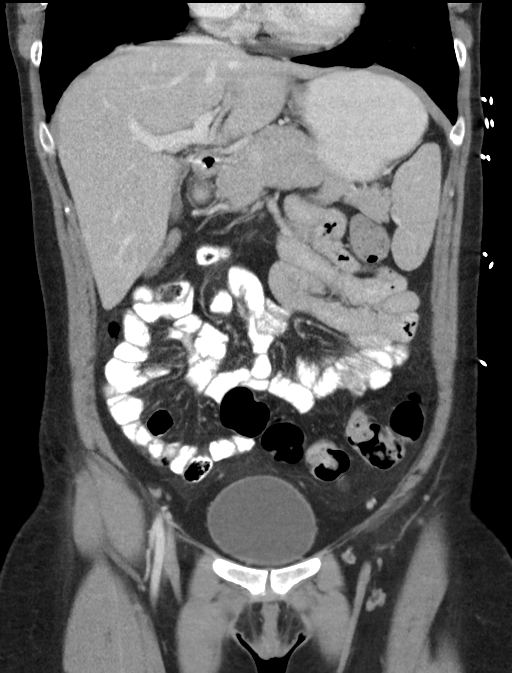
[im 34/77  soft-tissue]
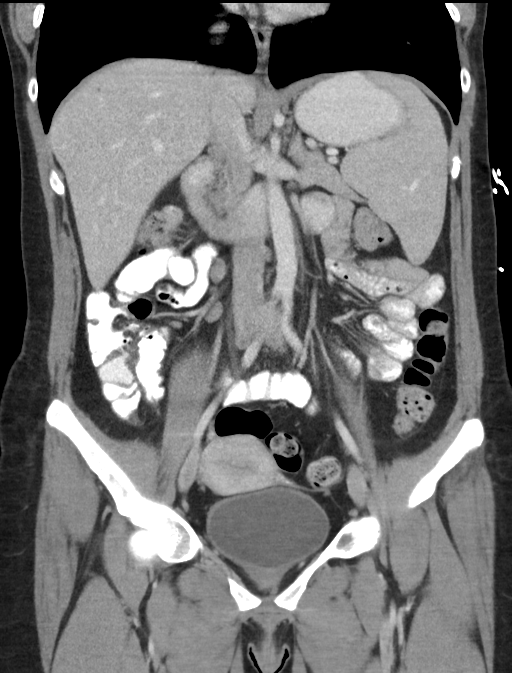
[im 43/77  soft-tissue]
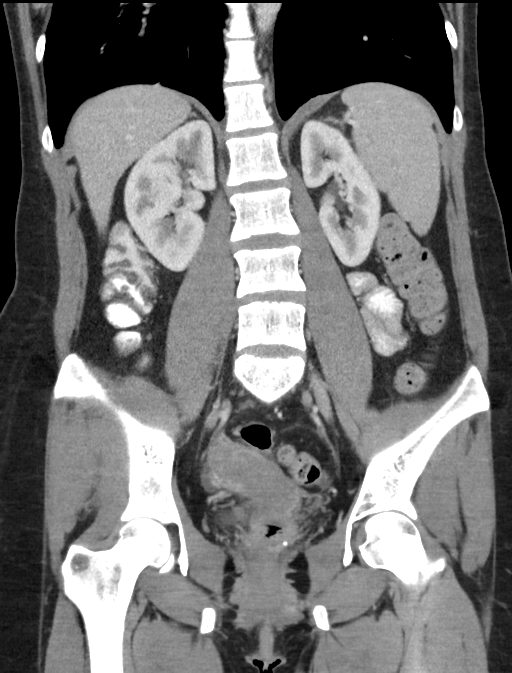

[16 of 46 positions shown; findings below may reference images not displayed]

FINDINGS: Lower chest: Lung bases are clear. No effusions. Heart is normal
size.

Hepatobiliary: No focal hepatic abnormality. Gallbladder
unremarkable.

Pancreas: No focal abnormality or ductal dilatation.

Spleen: No focal abnormality.  Normal size.

Adrenals/Urinary Tract: No adrenal abnormality. No focal renal
abnormality. No stones or hydronephrosis. Urinary bladder is
unremarkable.

Stomach/Bowel: Stomach, large and small bowel grossly unremarkable.
Appendix mildly prominent at 8 mm. No surrounding inflammatory
changes.

Vascular/Lymphatic: No evidence of aneurysm or adenopathy.

Reproductive: Uterus and adnexa unremarkable.  No mass.

Other: Small amount of free fluid in the pelvis.  No free air.

Musculoskeletal: No acute bony abnormality.
IMPRESSION: Appendix borderline in diameter at 8 mm. No surrounding inflammatory
change.

Small amount of free fluid in the pelvis, likely physiologic.

## 2018-09-08 ENCOUNTER — Ambulatory Visit
Admission: RE | Admit: 2018-09-08 | Discharge: 2018-09-08 | Disposition: A | Discharge status: Home / Self Care | Payer: Managed Care, Other (non HMO) | Source: Ambulatory Visit | Attending: Internal Medicine | Admitting: Internal Medicine

## 2018-09-08 ENCOUNTER — Other Ambulatory Visit: Payer: Self-pay | Admitting: Internal Medicine

## 2018-09-08 ENCOUNTER — Other Ambulatory Visit: Payer: Self-pay

## 2018-09-08 DIAGNOSIS — R109 Unspecified abdominal pain: Secondary | ICD-10-CM

## 2018-09-08 MED ORDER — IOHEXOL 300 MG/ML  SOLN
100.0000 mL | Freq: Once | INTRAMUSCULAR | Status: AC | PRN
  Administered 2018-09-08: 100 mL via INTRAVENOUS

## 2018-09-22 NOTE — Progress Notes (Signed)
 Elizabeth Kane is seen for annual exam and depression/anxiety, hyperlipidemia.  Recently seen with abdominal pain in the left lower quadrant; she was noted to have diverticulitis on CT scan.  Placed on Cipro, Flagyl, with improvement, though she had a lot of trouble with nausea with the Flagyl.  Is finished with a course of Cipro.  She notices that she has loose stools 2-3 times per day.  No bowel incontinence.  No fevers, chills.  The pain has resolved.  She has been eating a very low fiber diet and wonders whether she needs to add this back in.  She has been doing some research on diverticulitis.  Reports that there are multiple family numbers who had this, at least 1 of whom might have been in his 56s and wonders whether there is some genetic component to this..  Current Outpatient Medications  Medication Sig Dispense Refill  . norethindrone-ethinyl estradiol-iron (JUNEL FE 1.5/30, 28,) 1.5 mg-30 mcg (21)/75 mg (7) tablet Take 1 tablet by mouth once daily. 112 tablet 3  . promethazine (PHENERGAN) 12.5 MG tablet Take 1 tablet (12.5 mg total) by mouth every 8 (eight) hours as needed for Nausea 20 tablet 0  . sertraline (ZOLOFT) 50 MG tablet TAKE 1 TABLET BY MOUTH ONCE DAILY 30 tablet 5   No current facility-administered medications for this visit.     Allergies as of 09/22/2018  . (No Known Allergies)    Past Medical History:  Diagnosis Date  . Anxiety and depression    with suicide gestures in the past, evaluated by Psychiatry  . History of abnormal cervical Pap smear 12/18/17   Abnormal cells, non-cancer  . Pure hypercholesterolemia 12/09/2014  . Severe menstrual cramps    followed by Gynecology    Past Surgical History:  Procedure Laterality Date  . HERNIA REPAIR  As infant    Health Maintenance Topics with due status: Overdue     Topic Date Due   Hepatitis C Screen 12-23-1985    History reviewed. No pertinent family history.  Social History   Socioeconomic History  .  Marital status: Married    Spouse name: Not on file  . Number of children: Not on file  . Years of education: Not on file  . Highest education level: Not on file  Occupational History  . Occupation: accounts receivable  Social Needs  . Financial resource strain: Not on file  . Food insecurity:    Worry: Not on file    Inability: Not on file  . Transportation needs:    Medical: Not on file    Non-medical: Not on file  Tobacco Use  . Smoking status: Never Smoker  . Smokeless tobacco: Never Used  Substance and Sexual Activity  . Alcohol use: Yes    Comment: 2-4 glasses of wine per month  . Drug use: Never  . Sexual activity: Yes    Partners: Male    Birth control/protection: Pill  Other Topics Concern  . Not on file  Social History Narrative  . Not on file      ROS: Please see HPI; recent GYN exam; remainder of complete 10 point ROS is negative  BP 116/70   Pulse 98   Ht 157.5 cm (5' 2)   Wt 73.5 kg (162 lb)   SpO2 98%   BMI 29.63 kg/m   GENERAL:  Well developed, well nourished female, in no distress. HEENT: Pupils equal and round.  Contacts.  Extra ocular motion intact.  Oropharynx moist without lesions.  TMs clear. NECK:  Supple, trachea midline.  No thyromegaly.   CHEST:  Normal to palpation. LUNGS:  Clear to auscultation and percussion.  No retractions or wheezing.  CARDIAC:  Regular rate and rhythm, normal S1 and S2 without murmurs, rubs or gallops.   VASCULAR: Carotid and radial pulses 2+.  Distal pulses 2+ ABDOMEN: Soft, nontender, nondistended, positive bowel sounds.  No guard or rebound. GENITALIA/RECTAL: Deferred to gynecology EXTREMITIES:  No clubbing, cyanosis, edema.   NEUROLOGIC:  Cranial nerves grossly intact.  Motor and sensory exams appear intact and symmetrical  DERMATOLOGIC:   No significant rashes or nodules. LYMPH:  No cervical or supraclavicular lymphadenopathy    Routine general medical examination at a health care facility  (primary  encounter diagnosis) Diverticulitis Pure hypercholesterolemia Recurrent major depressive disorder, in full remission (CMS-HCC) Diarrhea, unspecified type Need for hepatitis C screening test  Assessment and Plan  1.  Diverticulitis.  Reviewed CT scan results with the patient.  The symptoms have completely resolved, fortunately.  The residual diarrhea may be from her low fiber diet, though would send stool for C. difficile to make sure she does not have antibiotic associated diarrhea as she did stop Flagyl on 1 of for a few days only on Cipro due to nausea, metallic taste.  Given how young she is, and question of family history of this at an early age, will get GI consultation for consideration for endoscopy. 2.  Depression/anxiety-stable on current regimen.  Encourage attention to diet, exercise.   3.  Hyperlipidemia.  Continue lifestyle efforts for this as well.  Monitor metabolic panel, lipids, TSH 4.  Health maintenance.  Follow-up with gynecology as scheduled. Return in 6 months, sooner if needed.    BERT JACK KLEIN III, MD  Portions of this note were created using dictation software and may contain typographical errors.

## 2018-09-27 NOTE — Progress Notes (Signed)
 PATIENT PROFILE: Elizabeth Kane is a 33 y.o. female who presents to the [facility] for a consultation at the request of Dr. Fernande for evaluation of Cdiff and diverticulitis.  HISTORY OF PRESENT ILLNESS: Elizabeth Kane reports having about a week of abd pain mainly in LLQ that became severe and was seen and had stat CT ordered showing diverticulitis, she reports her abd pain had improved significant by day 3 of the antibiotics. The day after she started the cipro/flagyl she developed loose stools 2-3x/day. Reports her normal baseline is 1 formed stool a day. She also had fairly severe nausea with the cipro/flagyl that has resolved w/ completing the antibiotic. Given loose stools she was checked for C. difficile which returned positive.  She denies any long-term GI issues. She denies any frequent abdominal pain prior to most recent episode of diverticulitis.  She feels she was moving stools well prior diverticulitis beginning and denies large intake of seeds, nuts, etc. No recent abx prior to diverticulitis. She denies any rectal bleeding. Abdominal pain is mild at this point compared to severe pain she had w/ diverticulitis.   She denies long term issues w/ gerd, n/v, dysphagia.     GENERAL REVIEW OF SYSTEMS:  Constitutional: No weight loss/weight gain   Eyes: No changes in vision. ENT: No oral lesions, sore throat.   GI: see HPI.   Heme/Lymph: No easy bruising.   CV: No chest pain. Resp: No cough, SOB.     GU: No hematuria.   Integumentary: No rashes.   Neuro: No headaches.   Psych: No depression/anxiety.   Endocrine: No heat/cold intolerance.   Musculoskeletal: No joint swelling.   MEDICATIONS: Outpatient Encounter Medications as of 09/27/2018  Medication Sig Dispense Refill  . norethindrone-ethinyl estradiol-iron (JUNEL FE 1.5/30, 28,) 1.5 mg-30 mcg (21)/75 mg (7) tablet Take 1 tablet by mouth once daily. 112 tablet 3  . promethazine (PHENERGAN) 12.5 MG tablet Take 1 tablet (12.5  mg total) by mouth every 8 (eight) hours as needed for Nausea 20 tablet 0  . sertraline (ZOLOFT) 50 MG tablet TAKE 1 TABLET BY MOUTH ONCE DAILY 30 tablet 5  . peg-electrolyte (NULYTELY) solution Take as directed for colonic prep. 4000 mL 0  . vancomycin (VANCOCIN) 250 MG capsule Take 1 capsule (250 mg total) by mouth 4 (four) times daily for 7 days (Patient not taking: Reported on 09/27/2018  ) 28 capsule 0  . vancomycin (VANCOCIN) 250 MG capsule Take 1 capsule (250 mg total) by mouth every 6 (six) hours for 3 days 12 capsule 0   No facility-administered encounter medications on file as of 09/27/2018.     ALLERGIES: Patient has no known allergies.  PAST MEDICAL HISTORY: Past Medical History:  Diagnosis Date  . Anxiety and depression    with suicide gestures in the past, evaluated by Psychiatry  . History of abnormal cervical Pap smear 12/18/17   Abnormal cells, non-cancer  . Pure hypercholesterolemia 12/09/2014  . Severe menstrual cramps    followed by Gynecology    PAST SURGICAL HISTORY: Past Surgical History:  Procedure Laterality Date  . HERNIA REPAIR  As infant     FAMILY HISTORY: History reviewed. No pertinent family history.   SOCIAL HISTORY: Social History   Socioeconomic History  . Marital status: Married    Spouse name: Not on file  . Number of children: Not on file  . Years of education: Not on file  . Highest education level: Not on file  Occupational History  .  Occupation: accounts receivable  Social Needs  . Financial resource strain: Not on file  . Food insecurity:    Worry: Not on file    Inability: Not on file  . Transportation needs:    Medical: Not on file    Non-medical: Not on file  Tobacco Use  . Smoking status: Never Smoker  . Smokeless tobacco: Never Used  Substance and Sexual Activity  . Alcohol use: Yes    Comment: 2-4 glasses of wine per month  . Drug use: Never  . Sexual activity: Yes    Partners: Male    Birth control/protection:  Pill  Other Topics Concern  . Not on file  Social History Narrative  . Not on file    Vitals:   09/27/18 1508  BP: (!) 123/91  BP Location: Left upper arm  Pulse: 85  Temp: 37.8 C (100 F)  TempSrc: Oral  Weight: 74.4 kg (164 lb)  Height: 160 cm (5' 3)  mild temp-may be d/t mask, denies fever chills at home.   Wt Readings from Last 3 Encounters:  09/27/18 74.4 kg (164 lb)  09/22/18 73.5 kg (162 lb)  09/08/18 73.6 kg (162 lb 3.2 oz)     PHYSICAL EXAM: General: NAD, alert and oriented x 4 HEENT: PEERLA, EOMI, anciteric  Neck: supple, no JVD or thyromegaly. No lymphadenopathy.  Respiratory: CTA bilaterally, no wheezes, crackles, or other adventitious sounds Cardiac: RRR, no murmur, rub, or gallop  Abd: soft, normal bowel sounds, very minimal TTP bilateral lower abd, no HSM, no rebound or guarding Ext: no edema, well perfused with 2+ pulses, Skin: Skin color, texture, turgor normal, no rashes or lesions Lymph: no LAD Neuro: Grossly intact   REVIEW OF DATA: I have reviewed the following data today:  09/08/18-cbc normal, cmp normal except K+ 3.5  CT a/p 09/08/18 -  IMPRESSION: Findings consistent with focal diverticulitis in the junction of the descending and sigmoid colons. No abscess or perforation is identified. The remainder of the exam is stable from the prior study.  ASSESSMENT AND PLAN: Elizabeth Kane is a 33 y.o. female presenting for an initial consultation.   Diagnoses and all orders for this visit:  Diverticulitis -     Ambulatory Referral to Colonoscopy  C. difficile diarrhea -     Ambulatory Referral to Colonoscopy  Other orders -     vancomycin (VANCOCIN) 250 MG capsule; Take 1 capsule (250 mg total) by mouth every 6 (six) hours for 3 days -     peg-electrolyte (NULYTELY) solution; Take as directed for colonic prep.   1. Diverticulitis-this was first episode of CT confirmed diverticulitis, she reports a paternal grandmother w/ hx of diverticulitis. She  usually moves stools regularly. Reviewed eliminating seeds, nuts, etc and starting fiber supplement such as benefiber long term. Will plan for colonoscopy in abt 8 weeks for structural evaluation  2. C diff - may have been increased risk given cipro use, will extend her course to 10 days of vancomycin. Infectious precautions given. Reviewed to notify me if symptoms do not improve or return.   I reviewed the risks (including bleeding, perforation, infection, anesthesia complications, cardiac/respiratory complications), benefits and alternatives of colonoscopy. Patient consents to proceed. Denies CP/SOB/blood thinners.    I personally performed the service, non-incident to. [facility])   JANICE BRIDGES [place], [place]   Romero NOVAK. Mevelyn RIGGERS [facility]

## 2020-09-19 IMAGING — CT CT ABDOMEN AND PELVIS WITH CONTRAST
2 of 4 series · 15 of 46 positions shown, 17 images · IV contrast (APPLIED)
Comparison: 06/23/2016

CLINICAL DATA: Left lower quadrant pain for several days

EXAM:
CT ABDOMEN AND PELVIS WITH CONTRAST
TECHNIQUE: Multidetector CT imaging of the abdomen and pelvis was performed
using the standard protocol following bolus administration of
intravenous contrast.
CONTRAST:  100mL OMNIPAQUE IOHEXOL 300 MG/ML  SOLN

[Series 2: axial st · axial · 0.88mm/px · z∈[-496,-96]mm · 12 of 88 slices shown, 14 images]
[im 4/88  soft-tissue]
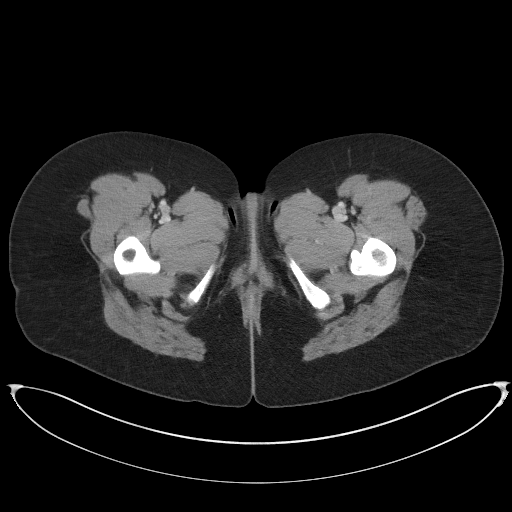
[im 4/88  bone]
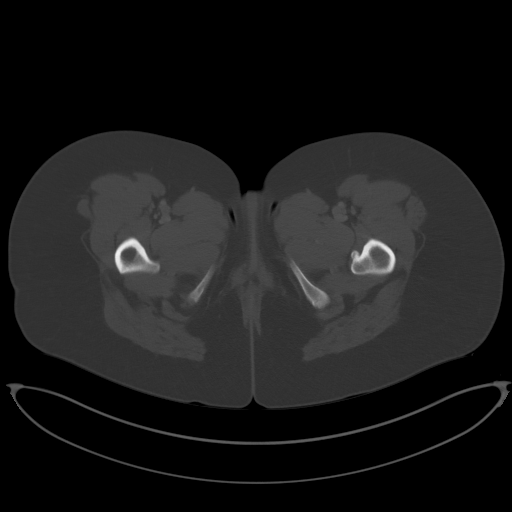
[im 11/88  soft-tissue]
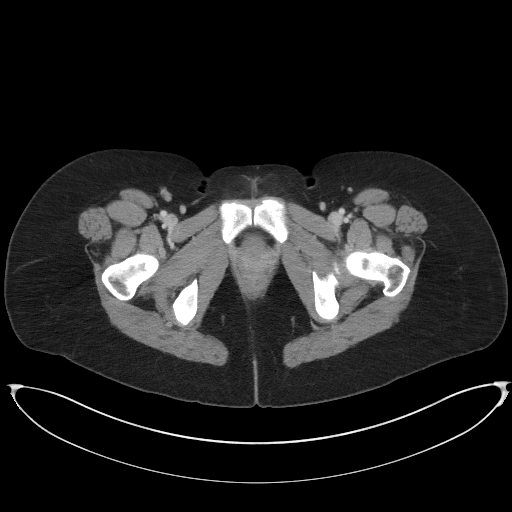
[im 19/88  soft-tissue]
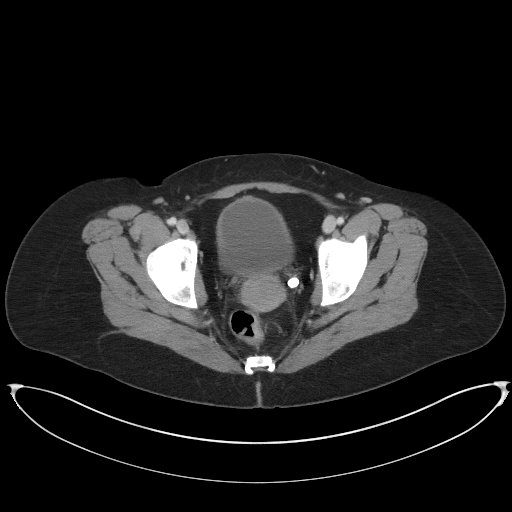
[im 26/88  soft-tissue]
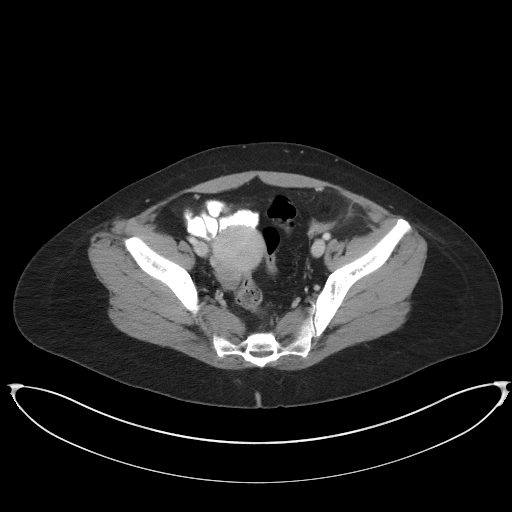
[im 33/88  soft-tissue]
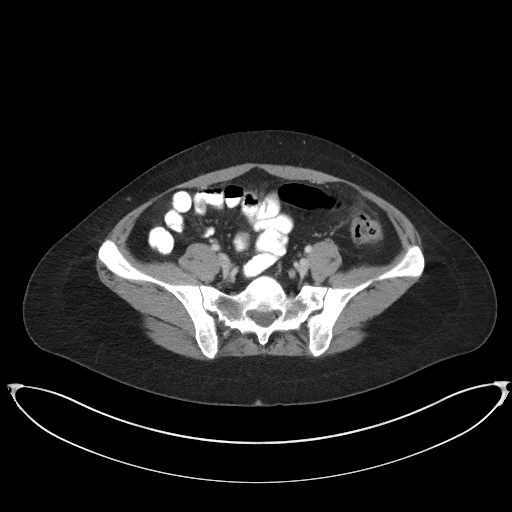
[im 40/88  soft-tissue]
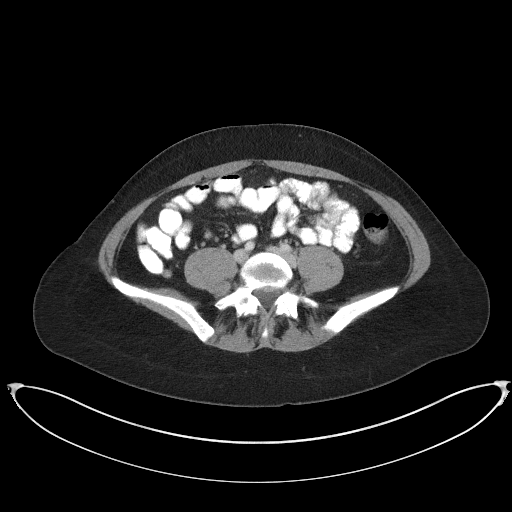
[im 48/88  soft-tissue]
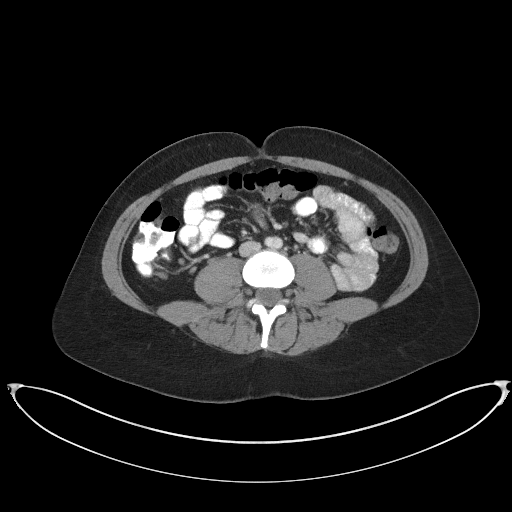
[im 55/88  soft-tissue]
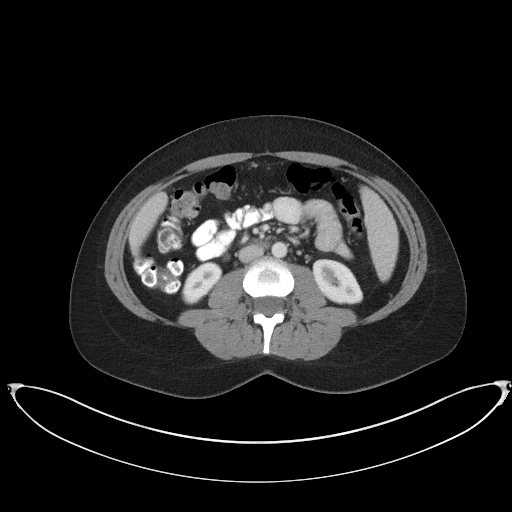
[im 62/88  soft-tissue]
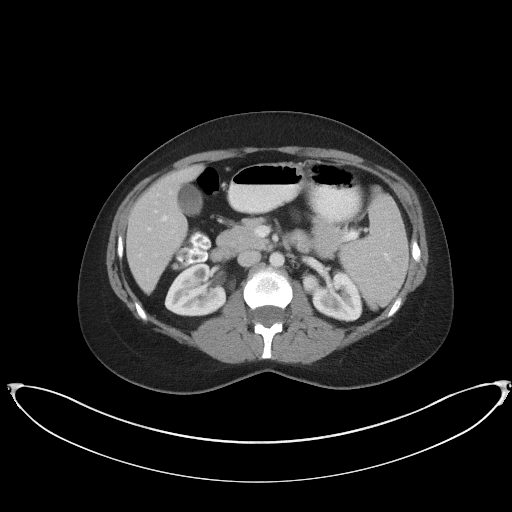
[im 62/88  bone]
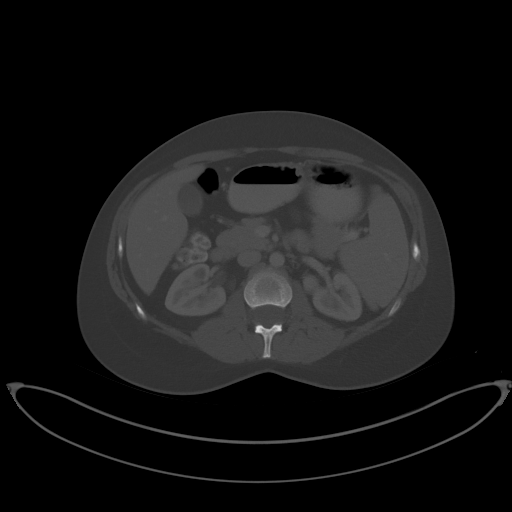
[im 69/88  soft-tissue]
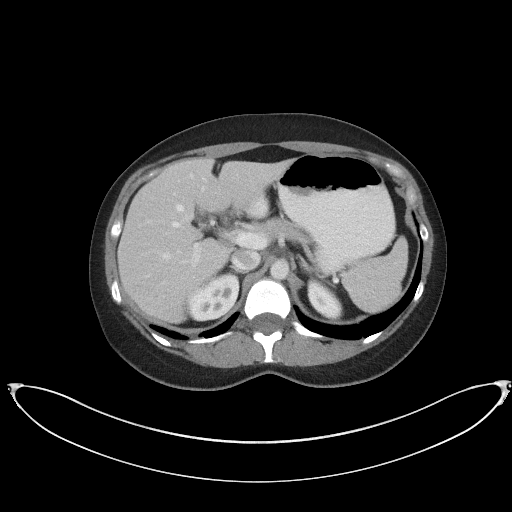
[im 77/88  soft-tissue]
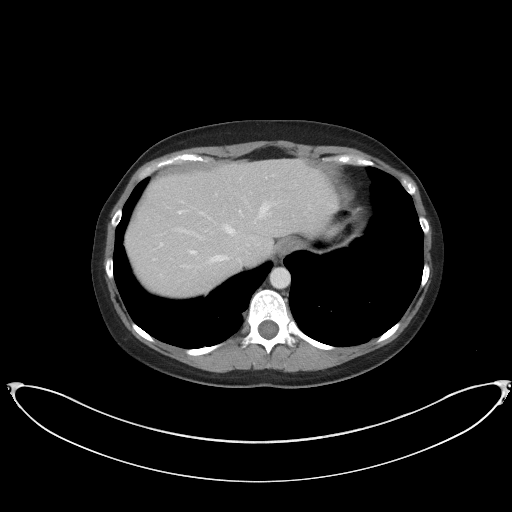
[im 84/88  soft-tissue]
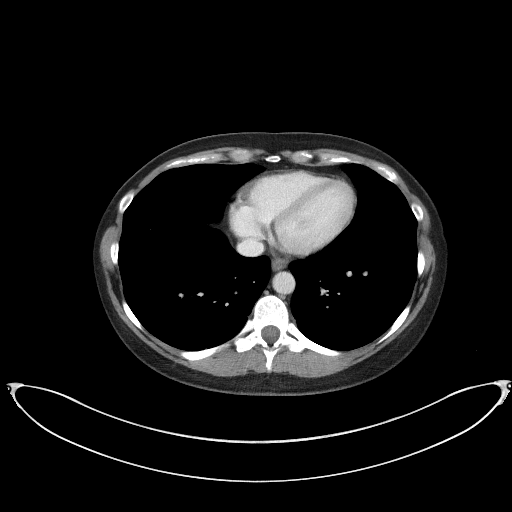

[Series 5: coronal st · coronal · 0.63mm/px · 3 of 80 slices shown]
[im 27/80  soft-tissue]
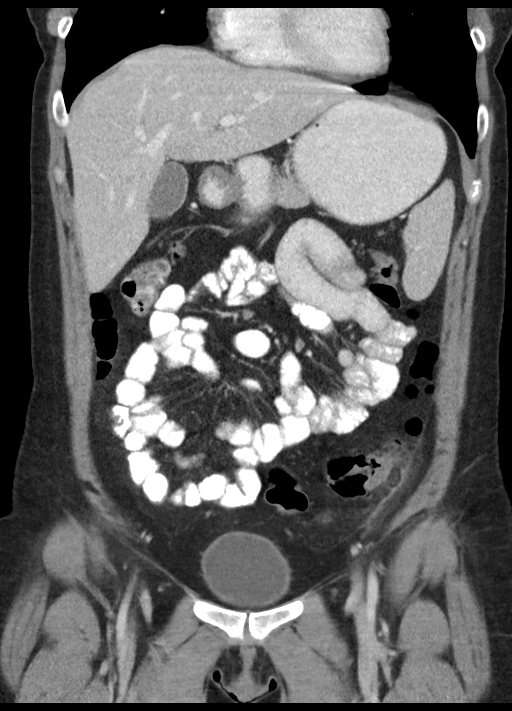
[im 36/80  soft-tissue]
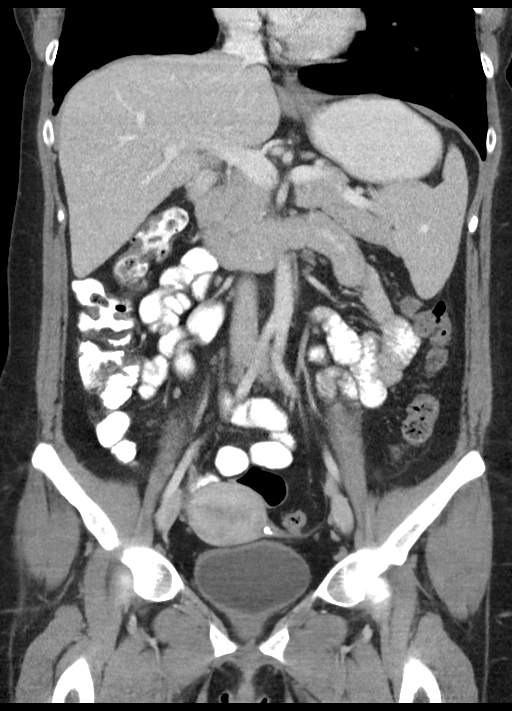
[im 44/80  soft-tissue]
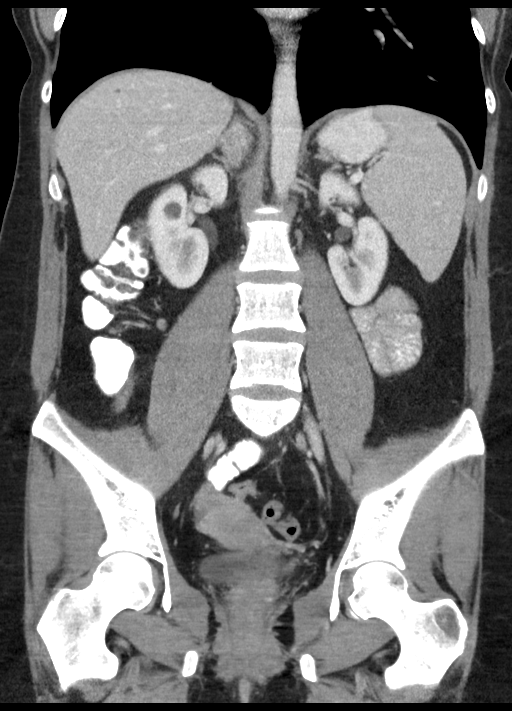

[15 of 46 positions shown; findings below may reference images not displayed]

FINDINGS: Lower chest: Mild scarring in the right lower lobe is noted stable
from the prior exam.

Hepatobiliary: Scattered hypodensities are noted throughout the
liver likely representing small cysts but too small for adequate
characterization.

Pancreas: Unremarkable. No pancreatic ductal dilatation or
surrounding inflammatory changes.

Spleen: Normal in size without focal abnormality.

Adrenals/Urinary Tract: Adrenal glands are within normal limits.
Kidneys are well visualized bilaterally. No renal calculi or
obstructive changes are seen. Cysts are noted within the right
kidney. Bladder is partially distended.

Stomach/Bowel: There is focal thickening of the colon at the
junction of the descending and sigmoid colons with pericolonic
inflammatory change consistent with diverticulitis. No findings to
suggest focal abscess formation is identified. The appendix is
within normal limits. No small bowel or gastric abnormality is seen.

Vascular/Lymphatic: No significant vascular findings are present. No
enlarged abdominal or pelvic lymph nodes.

Reproductive: Uterus and bilateral adnexa are unremarkable.

Other: No abdominal wall hernia or abnormality. No abdominopelvic
ascites.

Musculoskeletal: No acute or significant osseous findings.
IMPRESSION: Findings consistent with focal diverticulitis in the junction of the
descending and sigmoid colons. No abscess or perforation is
identified.

The remainder of the exam is stable from the prior study.

## 2023-02-14 ENCOUNTER — Encounter (HOSPITAL_COMMUNITY): Payer: Self-pay | Admitting: Emergency Medicine

## 2023-02-14 ENCOUNTER — Ambulatory Visit (HOSPITAL_COMMUNITY)
Admission: EM | Admit: 2023-02-14 | Discharge: 2023-02-14 | Disposition: A | Discharge status: Home / Self Care | Payer: BC Managed Care – PPO | Attending: Internal Medicine | Admitting: Internal Medicine

## 2023-02-14 ENCOUNTER — Other Ambulatory Visit: Payer: Self-pay

## 2023-02-14 DIAGNOSIS — G510 Bell's palsy: Secondary | ICD-10-CM | POA: Diagnosis not present

## 2023-02-14 MED ORDER — VALACYCLOVIR HCL 1 G PO TABS: 1000.0000 mg | ORAL_TABLET | Freq: Three times a day (TID) | ORAL | 0 refills | Status: AC

## 2023-02-14 MED ORDER — PREDNISONE 50 MG PO TABS: 50.0000 mg | ORAL_TABLET | Freq: Every day | ORAL | 0 refills | Status: AC

## 2023-02-14 NOTE — Discharge Instructions (Addendum)
Please take medications as prescribed Anticipate that the headache and left facial pain as well as weakness will improve within the next several days You may take Tylenol as needed for pain. If you have worsening symptoms please return to urgent care to be reevaluated.

## 2023-02-14 NOTE — ED Triage Notes (Signed)
Patient states this is not a normal headache.  Reports pain started on left back of head/neck one week ago.  As the week progressed , pain has spread to other areas of left side of head.  Pain is worse when lying down.  Patient reports using a heating pad on her head.   Denies vision changes.  Patients smile is not symmetrical.  Smile is lower on left side of face.  Left side of face feels sore to the touch, "feels like a bruise"  Patient has tried tylenol and Advil dual action, and mucinex-none seems to make any difference.    No weakness in extremities

## 2023-02-14 NOTE — ED Provider Notes (Signed)
[facility]    CSN: [id number] Arrival date & time: 02/14/23  4401      History   Chief Complaint Chief Complaint  Patient presents with   Headache    HPI Elizabeth Kane is a 37 y.o. female comes to the urgent care with 7-day history of left-sided headache, left-sided facial weakness and left-sided facial pain.  Patient's symptoms started insidiously and has been persistent over the past several days.  Headache is moderate in severity, aching, constant with no known relieving factors.  She has tried taking ibuprofen and Tylenol with no improvement.  Patient endorses weakness on the left side of the face.  No loss of sensation.  No extremity weakness.  No voice changes.  No difficulty with her speech.  No syncope or near syncopal episodes.  Patient denies any trauma to her head.  Denies any rash on the face.  No history of shingles.   HPI  History reviewed. No pertinent past medical history.  There are no active problems to display for this patient.   Past Surgical History:  Procedure Laterality Date   UMBILICAL HERNIA REPAIR  child    OB History   No obstetric history on file.      Home Medications    Prior to Admission medications   Medication Sig Start Date End Date Taking? Authorizing Provider  buPROPion (WELLBUTRIN XL) 150 MG 24 hr tablet Take 150 mg by mouth every morning. 01/12/23  Yes [provider]  hydrOXYzine (ATARAX) 25 MG tablet Take 12.5-25 mg by mouth every 8 (eight) hours as needed. 01/12/23  Yes [provider]  predniSONE (DELTASONE) 50 MG tablet Take 1 tablet (50 mg total) by mouth daily for 10 days. 02/14/23 02/24/23 Yes Ahlia Lemanski, Britta Mccreedy, MD  valACYclovir (VALTREX) 1000 MG tablet Take 1 tablet (1,000 mg total) by mouth 3 (three) times daily for 7 days. 02/14/23 02/21/23 Yes Roslyn Else, Britta Mccreedy, MD  HYDROcodone-acetaminophen (NORCO) 5-325 MG tablet Take 2 tablets by mouth every 6 (six) hours as needed for moderate  pain. Patient not taking: Reported on 02/14/2023 06/23/16   Arnaldo Natal, MD    Family History Family History  Problem Relation Age of Onset   Hypertension Mother    Diabetes Maternal Grandmother    Heart disease Maternal Grandfather    Heart disease Paternal Grandmother    Heart disease Paternal Grandfather     Social History Social History   Tobacco Use   Smoking status: Former    Current packs/day: 0.00    Types: Cigarettes    Quit date: 2016    Years since quitting: 8.9   Smokeless tobacco: Never  Vaping Use   Vaping status: Never Used  Substance Use Topics   Alcohol use: Yes   Drug use: No     Allergies   Patient has no known allergies.   Review of Systems Review of Systems As per HPI  Physical Exam Triage Vital Signs ED Triage Vitals  Encounter Vitals Group     BP 02/14/23 0853 123/83     Systolic BP Percentile --      Diastolic BP Percentile --      Pulse Rate 02/14/23 0853 74     Resp 02/14/23 0853 18     Temp 02/14/23 0853 98.3 F (36.8 C)     Temp Source 02/14/23 0853 Oral     SpO2 02/14/23 0853 97 %     Weight --      Height --  Head Circumference --      Peak Flow --      Pain Score 02/14/23 0848 6     Pain Loc --      Pain Education --      Exclude from Growth Chart --    No data found.  Updated Vital Signs BP 123/83 (BP Location: Left Arm)   Pulse 74   Temp 98.3 F (36.8 C) (Oral)   Resp 18   LMP 02/18/2021 (Approximate)   SpO2 97%   Visual Acuity Right Eye Distance:   Left Eye Distance:   Bilateral Distance:    Right Eye Near:   Left Eye Near:    Bilateral Near:     Physical Exam Vitals and nursing note reviewed.  Constitutional:      General: She is not in acute distress.    Appearance: She is not ill-appearing.  Eyes:     General: No visual field deficit. Cardiovascular:     Rate and Rhythm: Normal rate and regular rhythm.  Pulmonary:     Effort: Pulmonary effort is normal.     Breath sounds: Normal  breath sounds.  Abdominal:     General: Bowel sounds are normal.     Palpations: Abdomen is soft.  Musculoskeletal:        General: Normal range of motion.     Cervical back: Normal range of motion.  Skin:    General: Skin is warm.  Neurological:     Mental Status: She is alert.     GCS: GCS eye subscore is 4. GCS verbal subscore is 5. GCS motor subscore is 6.     Cranial Nerves: No cranial nerve deficit.     Comments: Left-sided facial weakness.  Sensation is intact.  Tongue is midline.  Extraocular movements are intact.  Patient is able to shrug her shoulders with no weakness.  Power is 5/5 in all extremities.      UC Treatments / Results  Labs (all labs ordered are listed, but only abnormal results are displayed) Labs Reviewed - No data to display  EKG   Radiology No results found.  Procedures Procedures (including critical care time)  Medications Ordered in UC Medications - No data to display  Initial Impression / Assessment and Plan / UC Course  I have reviewed the triage vital signs and the nursing notes.  Pertinent labs & imaging results that were available during my care of the patient were reviewed by me and considered in my medical decision making (see chart for details).     1.  Bell's palsy: Prednisone 50 mg orally daily for 10 days Valacyclovir 1 g 3 times daily for 7 days Patient may use extra strength Tylenol as needed for headaches Return precautions given. Final Clinical Impressions(s) / UC Diagnoses   Final diagnoses:  Bell's palsy     Discharge Instructions      Please take medications as prescribed Anticipate that the headache and left facial pain as well as weakness will improve within the next several days You may take Tylenol as needed for pain. If you have worsening symptoms please return to urgent care to be reevaluated.   ED Prescriptions     Medication Sig Dispense Auth. Provider   predniSONE (DELTASONE) 50 MG tablet Take 1  tablet (50 mg total) by mouth daily for 10 days. 10 tablet Pierre Dellarocco, Britta Mccreedy, MD   valACYclovir (VALTREX) 1000 MG tablet Take 1 tablet (1,000 mg total) by mouth 3 (three) times  daily for 7 days. 21 tablet Kaylie Ritter, Britta Mccreedy, MD      I have reviewed the PDMP during this encounter.   Merrilee Jansky, MD 02/14/23 1040

## 2023-10-04 ENCOUNTER — Telehealth: Payer: Self-pay

## 2023-10-04 ENCOUNTER — Inpatient Hospital Stay (HOSPITAL_COMMUNITY): Payer: Self-pay

## 2023-10-04 ENCOUNTER — Inpatient Hospital Stay (HOSPITAL_COMMUNITY)
Admission: AD | Admit: 2023-10-04 | Discharge: 2023-10-04 | Disposition: A | Discharge status: Home / Self Care | Payer: Self-pay | Attending: Obstetrics and Gynecology | Admitting: Obstetrics and Gynecology

## 2023-10-04 DIAGNOSIS — O3480 Maternal care for other abnormalities of pelvic organs, unspecified trimester: Secondary | ICD-10-CM | POA: Insufficient documentation

## 2023-10-04 DIAGNOSIS — O3680X0 Pregnancy with inconclusive fetal viability, not applicable or unspecified: Secondary | ICD-10-CM | POA: Insufficient documentation

## 2023-10-04 DIAGNOSIS — O2631 Retained intrauterine contraceptive device in pregnancy, first trimester: Secondary | ICD-10-CM

## 2023-10-04 DIAGNOSIS — O26899 Other specified pregnancy related conditions, unspecified trimester: Secondary | ICD-10-CM | POA: Insufficient documentation

## 2023-10-04 DIAGNOSIS — O2630 Retained intrauterine contraceptive device in pregnancy, unspecified trimester: Secondary | ICD-10-CM

## 2023-10-04 DIAGNOSIS — Z3A00 Weeks of gestation of pregnancy not specified: Secondary | ICD-10-CM | POA: Insufficient documentation

## 2023-10-04 DIAGNOSIS — Z975 Presence of (intrauterine) contraceptive device: Secondary | ICD-10-CM | POA: Insufficient documentation

## 2023-10-04 DIAGNOSIS — O26859 Spotting complicating pregnancy, unspecified trimester: Secondary | ICD-10-CM | POA: Insufficient documentation

## 2023-10-04 DIAGNOSIS — N8311 Corpus luteum cyst of right ovary: Secondary | ICD-10-CM | POA: Insufficient documentation

## 2023-10-04 DIAGNOSIS — R109 Unspecified abdominal pain: Secondary | ICD-10-CM | POA: Insufficient documentation

## 2023-10-04 DIAGNOSIS — Z3A01 Less than 8 weeks gestation of pregnancy: Secondary | ICD-10-CM

## 2023-10-04 LAB — COMPREHENSIVE METABOLIC PANEL WITH GFR
ALT: 11 U/L (ref 0–44)
AST: 17 U/L (ref 15–41)
Albumin: 3.9 g/dL (ref 3.5–5.0)
Alkaline Phosphatase: 47 U/L (ref 38–126)
Anion gap: 6 (ref 5–15)
BUN: 13 mg/dL (ref 6–20)
CO2: 27 mmol/L (ref 22–32)
Calcium: 9.3 mg/dL (ref 8.9–10.3)
Chloride: 103 mmol/L (ref 98–111)
Creatinine, Ser: 0.8 mg/dL (ref 0.44–1.00)
GFR, Estimated: >60 mL/min (ref >60)
Glucose, Bld: 94 mg/dL (ref 70–99)
Potassium: 3.9 mmol/L (ref 3.5–5.1)
Sodium: 136 mmol/L (ref 135–145)
Total Bilirubin: 0.5 mg/dL (ref 0.0–1.2)
Total Protein: 6.5 g/dL (ref 6.5–8.1)

## 2023-10-04 LAB — URINALYSIS, ROUTINE W REFLEX MICROSCOPIC
Bilirubin Urine: NEGATIVE
Glucose, UA: NEGATIVE mg/dL
Ketones, ur: NEGATIVE mg/dL
Leukocytes,Ua: NEGATIVE
Nitrite: NEGATIVE
Protein, ur: NEGATIVE mg/dL
Specific Gravity, Urine: 1.012 (ref 1.005–1.030)
pH: 6 (ref 5.0–8.0)

## 2023-10-04 LAB — CBC
HCT: 42.4 % (ref 36.0–46.0)
Hemoglobin: 14.5 g/dL (ref 12.0–15.0)
MCH: 32.1 pg (ref 26.0–34.0)
MCHC: 34.2 g/dL (ref 30.0–36.0)
MCV: 93.8 fL (ref 80.0–100.0)
Platelets: 309 K/uL (ref 150–400)
RBC: 4.52 MIL/uL (ref 3.87–5.11)
RDW: 12.8 % (ref 11.5–15.5)
WBC: 10.3 K/uL (ref 4.0–10.5)
nRBC: 0 % (ref 0.0–0.2)

## 2023-10-04 LAB — POCT PREGNANCY, URINE: Preg Test, Ur: POSITIVE — AB

## 2023-10-04 LAB — HCG, QUANTITATIVE, PREGNANCY: hCG, Beta Chain, Quant, S: 1008 m[IU]/mL — ABNORMAL HIGH (ref <5)

## 2023-10-04 LAB — ABO/RH: ABO/RH(D): A POS

## 2023-10-04 NOTE — Progress Notes (Signed)
 Written and verbal d/c instructions given by Dr Leveque and pt voiced understanding. PT then d/c home by provider

## 2023-10-04 NOTE — MAU Note (Signed)
 Elizabeth Kane is a 38 y.o. at Unknown here in MAU reporting: has IUD since 2022 [place]). Stated last week having lower abd cramping. Yesterday had some dark red/brwon bleeding with wiping. ( Hs not has andy bleeding or cramping since IUd was placed.  Did take a HPT over the weekend and it was positive. Pelvic pressure  is main complaint today  LMP: IUD Onset of complaint: 1 week Pain score: 2-3 Vitals:   10/04/23 1853  BP: 116/86  Pulse: 79  Resp: 18  Temp: 98.9 F (37.2 C)     FHT:   Lab orders placed from triage: HCG

## 2023-10-04 NOTE — MAU Provider Note (Signed)
  History     CSN: [id number]  Arrival date and time: 10/04/23 1815   Event Date/Time   First Provider Initiated Contact with Patient    Chief Complaint  Patient presents with   Vaginal Bleeding   Abdominal Pain    HPI  Elizabeth Kane is a 38 y.o. No obstetric history on file. at Unknown who presents to the MAU for positive home pregnancy with IUD in place. Patient reports abdominal cramping that started about a month ago, 1 episode of spotting 2 weeks ago, and scant to light bleeding that is grainy and purple in color with a pad since Sunday. Patient also states she is not feeling like herself and crying more than usual. Home pregnancy test last night and this morning were positive. IUD has been in place since 2022, confirmed by ultrasound in July 2024. No menstrual bleeding since. Denies nausea, vomiting.  No past medical history on file.  Past Surgical History:  Procedure Laterality Date   UMBILICAL HERNIA REPAIR  child    Family History  Problem Relation Age of Onset   Hypertension Mother    Diabetes Maternal Grandmother    Heart disease Maternal Grandfather    Heart disease Paternal Grandmother    Heart disease Paternal Grandfather     Social History   Tobacco Use   Smoking status: Former    Current packs/day: 0.00    Types: Cigarettes    Quit date: 2016    Years since quitting: 9.6   Smokeless tobacco: Never  Vaping Use   Vaping status: Never Used  Substance Use Topics   Alcohol use: Yes   Drug use: No    Allergies: No Known Allergies  Medications Prior to Admission  Medication Sig Dispense Refill Last Dose/Taking   buPROPion (WELLBUTRIN XL) 150 MG 24 hr tablet Take 150 mg by mouth every morning.      HYDROcodone -acetaminophen  (NORCO) 5-325 MG tablet Take 2 tablets by mouth every 6 (six) hours as needed for moderate pain. (Patient not taking: Reported on 02/14/2023) 6 tablet 0    hydrOXYzine (ATARAX) 25 MG tablet Take 12.5-25 mg by mouth every 8 (eight)  hours as needed.       ROS reviewed and pertinent positives and negatives as documented in HPI.  Physical Exam   Blood pressure 116/86, pulse 79, temperature 98.9 F (37.2 C), resp. rate 18, height 5' 3 (1.6 m), weight 63.5 kg.  Physical Exam  General: Alert but anxious, well-appearing female, in NAD HEENT: Normocephalic, atraumatic, EOM grossly intact Respiratory: Breathing comfortably on room air Abdomen: Soft, non-tender, non-distended Extremities: Moves all four extremities appropriately, normal gait. No peripheral edema Neuro: Alert, no obvious focal deficits Skin: No lesions/rashes visualized  MAU Course  Procedures  MDM 38 y.o. No obstetric history on file. at Unknown presenting for positive home pregnancy test with IUD. Will initiate work up with CBC, ultrasound, bHCG, and ABO.   Assessment and Plan   1. Pregnancy with IUD in place, antepartum    Probable early intrauterine gestational sac, but no yolk sac, fetal pole, or cardiac activity yet visualized. Recommend follow-up quantitative B-HCG levels and follow-up US  in 14 days to assess viability.  ABO - A positive CBC - wnl CMP - wnl HCG - 1008  Pt and partner unsure of desire to parent.   Discharge home, stable condition. Return precautions provided. OB provider list provided; establish care with provider of choice  Elizabeth Shropshire, MD 10/04/2023, 8:36 PM

## 2023-10-04 NOTE — Telephone Encounter (Signed)
 Patient called into office and reported she was having brown bleeding for a few days, had severe cramping on 8/3 that has since resolved. Pt reported had Kyleena IUD placed in 11/2020. Pt reported felt like something was different with the bleeding and cramping so decided to take home pregnancy test which came back positive. Pt reported has upcoming appointment in [place] on 8/11. RN advised for patient to be evaluated at [facility]U. Patient verbalized understanding. Pt reported was unsure about being seen due to no insurance. RN reported that the hospital would not deny her care due to no insurance. RN discussed how to apply for pregnancy Medicaid if interested. Pt reported plans to go to MAU later today.  Silvano LELON Piano, RN

## 2023-10-04 NOTE — Discharge Instructions (Signed)

## 2023-10-10 ENCOUNTER — Ambulatory Visit: Payer: Self-pay
